# Patient Record
Sex: Male | Born: 1995 | Race: Black or African American | Hispanic: No | Marital: Single | Smoking: Never smoker
Health system: Southern US, Community
[De-identification: ages and names within clinical notes are randomized; demographics above are authoritative.]

## PROBLEM LIST (undated history)

## (undated) HISTORY — PX: MOUTH SURGERY: SHX715

---
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
## 2017-06-11 ENCOUNTER — Emergency Department: Payer: No Typology Code available for payment source

## 2017-06-11 ENCOUNTER — Encounter: Payer: Self-pay | Admitting: Emergency Medicine

## 2017-06-11 ENCOUNTER — Emergency Department
Admission: EM | Admit: 2017-06-11 | Discharge: 2017-06-11 | Disposition: A | Discharge status: Home / Self Care | Payer: No Typology Code available for payment source | Attending: Emergency Medicine | Admitting: Emergency Medicine

## 2017-06-11 DIAGNOSIS — M25552 Pain in left hip: Secondary | ICD-10-CM | POA: Diagnosis present

## 2017-06-11 DIAGNOSIS — Y9389 Activity, other specified: Secondary | ICD-10-CM | POA: Insufficient documentation

## 2017-06-11 DIAGNOSIS — Y998 Other external cause status: Secondary | ICD-10-CM | POA: Diagnosis not present

## 2017-06-11 DIAGNOSIS — M545 Low back pain: Secondary | ICD-10-CM | POA: Diagnosis not present

## 2017-06-11 DIAGNOSIS — Y92410 Unspecified street and highway as the place of occurrence of the external cause: Secondary | ICD-10-CM | POA: Diagnosis not present

## 2017-06-11 DIAGNOSIS — M25559 Pain in unspecified hip: Secondary | ICD-10-CM

## 2017-06-11 MED ORDER — MELOXICAM 7.5 MG PO TABS: 7.5000 mg | ORAL_TABLET | Freq: Every day | ORAL | 1 refills | Status: AC
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
## 2019-11-05 ENCOUNTER — Encounter: Payer: Self-pay | Admitting: Family Medicine

## 2019-11-05 ENCOUNTER — Other Ambulatory Visit: Payer: Self-pay

## 2019-11-05 ENCOUNTER — Ambulatory Visit: Payer: BC Managed Care – PPO | Admitting: Family Medicine

## 2019-11-05 DIAGNOSIS — Z202 Contact with and (suspected) exposure to infections with a predominantly sexual mode of transmission: Secondary | ICD-10-CM

## 2019-11-05 DIAGNOSIS — F419 Anxiety disorder, unspecified: Secondary | ICD-10-CM

## 2019-11-05 LAB — GRAM STAIN

## 2019-11-05 MED ORDER — AZITHROMYCIN 500 MG PO TABS
1000.0000 mg | ORAL_TABLET | Freq: Once | ORAL | Status: AC
  Administered 2019-11-05: 13:00:00 1000 mg via ORAL

## 2019-11-05 NOTE — Progress Notes (Signed)
[facility] Department STI clinic/screening visit  Subjective:  Shane Joseph is a 24 y.o. male being seen today for  Chief Complaint  Patient presents with  . Exposure to STD     The patient reports they do not have symptoms.   Patient has the following medical conditions:  There are no problems to display for this patient.   HPI  Pt reports he is a contact to chlamydia. Denies symptoms. Would like STI testing.  See flowsheet for further details and programmatic requirements.   Tdap: last one 2010, declines today   No components found for: HCV  The following portions of the patient's history were reviewed and updated as appropriate: allergies, current medications, past medical history, past social history, past surgical history and problem list.  Objective:  There were no vitals filed for this visit.   Physical Exam Constitutional:      Appearance: Normal appearance.  HENT:     Head: Normocephalic and atraumatic.     Comments: No nits or hair loss    Mouth/Throat:     Mouth: Mucous membranes are moist.     Pharynx: Oropharynx is clear. No oropharyngeal exudate or posterior oropharyngeal erythema.  Pulmonary:     Effort: Pulmonary effort is normal.  Abdominal:     General: Abdomen is flat.     Palpations: Abdomen is soft. There is no hepatomegaly or mass.     Tenderness: There is no abdominal tenderness.  Genitourinary:    Pubic Area: No rash or pubic lice.      Penis: Normal and circumcised.      Testes: Normal.     Epididymis:     Right: Normal.     Left: Normal.     Rectum: Normal.  Lymphadenopathy:     Head:     Right side of head: No preauricular or posterior auricular adenopathy.     Left side of head: No preauricular or posterior auricular adenopathy.     Cervical: No cervical adenopathy.     Upper Body:     Right upper body: No supraclavicular or axillary adenopathy.     Left upper body: No supraclavicular or axillary adenopathy.     Lower Body: Right inguinal adenopathy (shotty) present. Left inguinal adenopathy (shotty) present.  Skin:    General: Skin is warm and dry.     Findings: No rash.  Neurological:     Mental Status: He is alert and oriented to person, place, and time.       Assessment and Plan:  Shane Joseph is a 24 y.o. male presenting to the [facility] Department for STI screening   1. Exposure to sexually transmitted disease (STD) -Screenings today as below. Treat gram stain per standing order. -Patient does meet criteria for HepB, HepC Screening. Declines these screenings. -Counseled on warning s/sx and when to seek care. Recommended condom use with all sex and discussed importance of condom use for STI prevention. - [facility] [place] [facility] - Syphilis Serology, [place] - Gram stain - Gonococcus culture  2. Chlamydia contact -Treatment today as below. Pt to RTC if vomits < 2 hr after taking medicine. No known allergies to this medication. -Advised no sex for 7 days after both pt and partner completes treatment and encouraged condoms with all sex. - azithromycin (ZITHROMAX) tablet 1,000 mg  3. Anxiety Pt endorses anxiety upon flowsheet questioning and would like to speak with behavioral health, will refer.  - Ambulatory referral  to [facility]     Return for screening as needed.  No future appointments.  Kandee Keen, PA-C

## 2019-11-05 NOTE — Progress Notes (Signed)
Patient here for treatment as contact to Chlamydia, and agrees to other testing.Burt Knack, RN

## 2019-11-05 NOTE — Progress Notes (Signed)
Patient gram stain reviewed, no treatment indicated. Patient treated as contact to Chlamydia per SO.Marland KitchenBurt Knack, RN

## 2019-11-09 LAB — GONOCOCCUS CULTURE

## 2019-12-06 ENCOUNTER — Ambulatory Visit: Payer: Self-pay | Admitting: Licensed Clinical Social Worker

## 2020-04-24 ENCOUNTER — Other Ambulatory Visit: Payer: Self-pay

## 2020-04-24 ENCOUNTER — Encounter: Payer: Self-pay | Admitting: Physician Assistant

## 2020-04-24 ENCOUNTER — Ambulatory Visit: Payer: Self-pay | Admitting: Physician Assistant

## 2020-04-24 DIAGNOSIS — Z113 Encounter for screening for infections with a predominantly sexual mode of transmission: Secondary | ICD-10-CM

## 2020-04-24 NOTE — Progress Notes (Signed)
S:  Patient into clinic requesting a check of "bumps" in genital area.  States that he was seen by PCP about 2 weeks ago and diagnosed with "pearly penile papules" or Fordyce spots, and told that this is normal.  Declines screening today since was screened at PCP office and only wants bumps looked at as a second opinion.  States last sex was in February, prior to his last appointment here. O:  WDWN male in NAD, A&O x 3, pleasant and cooperative.  Pubic area without nits, lice, edema, erythema, lesions and inguinal adenopathy.   Shaft of penis with scattered papules, that are flesh colored and with smooth surface consistent with Fordyce spots. A/P:  1.  Reassured patient that the "bumps" are a normal variant and that there is no need to have them removed. 2.  Recommend using dye and fragrance free products to make the areas less prominent since skin irritants can cause the area to be more noticeable. 3.  Rec condoms with all sex. 4.  RTC prn.
